# Patient Record
Sex: Male | Born: 1985 | Race: White | Hispanic: No | Marital: Married | Smoking: Never smoker
Health system: Southern US, Community
[De-identification: ages and names within clinical notes are randomized; demographics above are authoritative.]

## PROBLEM LIST (undated history)

## (undated) HISTORY — PX: OTHER SURGICAL HISTORY: SHX169

---
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
## 2005-07-13 ENCOUNTER — Emergency Department (HOSPITAL_COMMUNITY): Admission: EM | Admit: 2005-07-13 | Discharge: 2005-07-13 | Payer: Self-pay | Admitting: Emergency Medicine

## 2020-08-29 ENCOUNTER — Ambulatory Visit: Payer: Self-pay | Admitting: Family Medicine

## 2020-09-06 ENCOUNTER — Ambulatory Visit (INDEPENDENT_AMBULATORY_CARE_PROVIDER_SITE_OTHER): Payer: Managed Care, Other (non HMO) | Admitting: Family Medicine

## 2020-09-06 ENCOUNTER — Encounter: Payer: Self-pay | Admitting: Family Medicine

## 2020-09-06 ENCOUNTER — Other Ambulatory Visit: Payer: Self-pay

## 2020-09-06 VITALS — BP 123/79 | HR 86 | Temp 97.9°F | Wt 161.8 lb

## 2020-09-06 DIAGNOSIS — Z92241 Personal history of systemic steroid therapy: Secondary | ICD-10-CM | POA: Diagnosis not present

## 2020-09-06 DIAGNOSIS — N62 Hypertrophy of breast: Secondary | ICD-10-CM | POA: Insufficient documentation

## 2020-09-06 DIAGNOSIS — Z13220 Encounter for screening for lipoid disorders: Secondary | ICD-10-CM

## 2020-09-06 DIAGNOSIS — R3911 Hesitancy of micturition: Secondary | ICD-10-CM | POA: Diagnosis not present

## 2020-09-06 DIAGNOSIS — R351 Nocturia: Secondary | ICD-10-CM

## 2020-09-06 DIAGNOSIS — Z0000 Encounter for general adult medical examination without abnormal findings: Secondary | ICD-10-CM

## 2020-09-06 NOTE — Assessment & Plan Note (Signed)
Possibly related to previous anabolic steroid use.  Check Testosterone, TSH and estradiol levels.

## 2020-09-06 NOTE — Patient Instructions (Signed)
Great to meet you today! Please have labs completed.  Schedule annual exam at your convenience.

## 2020-09-06 NOTE — Assessment & Plan Note (Signed)
Check UA and PSA 

## 2020-09-06 NOTE — Progress Notes (Signed)
Duane Oliver - 35 y.o. male MRN [id number]  Date of birth: Aug 07, 1985  Subjective Chief Complaint  Patient presents with  . Establish Care    HPI Duane Oliver is a 35 y.o. male here today for initial visit to establish care.  He has not had a PCP since high school.  Concerned about some mild gynecomastia and occasional tenderness around the nipple area.  States that he used anabolic steroid supplement while in college and did not cycle off of this.  Developed breast tissue after this.  He would like to have testosterone and estrogen levels checked today.    He also has had some urinary issues since using this.  Has some hesitancy with urination, mild straining at times.  Doesn't happen all the time.    Has some numb patches over areas of previous injuries.  This comes and goes. Area located on mid back and R great toe.  Not too bothersome.   ROS:  A comprehensive ROS was completed and negative except as noted per HPI  No Known Allergies  History reviewed. No pertinent past medical history.  Past Surgical History:  Procedure Laterality Date  . Broken bone      Social History   Socioeconomic History  . Marital status: Married    Spouse name: Not on file  . Number of children: Not on file  . Years of education: Not on file  . Highest education level: Not on file  Occupational History  . Not on file  Tobacco Use  . Smoking status: Never Smoker  . Smokeless tobacco: Never Used  Vaping Use  . Vaping Use: Never used  Substance and Sexual Activity  . Alcohol use: Yes    Alcohol/week: 4.0 standard drinks    Types: 4 Standard drinks or equivalent per week  . Drug use: Never  . Sexual activity: Yes    Partners: Female    Birth control/protection: Pill  Other Topics Concern  . Not on file  Social History Narrative  . Not on file   Social Determinants of Health   Financial Resource Strain: Not on file  Food Insecurity: Not on file  Transportation Needs: Not on file   Physical Activity: Not on file  Stress: Not on file  Social Connections: Not on file    Family History  Family history unknown: Yes    Health Maintenance  Topic Date Due  . Hepatitis C Screening  Never done  . HIV Screening  Never done  . TETANUS/TDAP  07/29/2029  . INFLUENZA VACCINE  Completed  . COVID-19 Vaccine  Completed     ----------------------------------------------------------------------------------------------------------------------------------------------------------------------------------------------------------------- Physical Exam BP 123/79 (BP Location: Left Arm, Patient Position: Sitting, Cuff Size: Normal)   Pulse 86   Temp 97.9 F (36.6 C)   Wt 161 lb 12.8 oz (73.4 kg)   SpO2 100%   Physical Exam Constitutional:      Appearance: Normal appearance.  Eyes:     General: No scleral icterus. Cardiovascular:     Rate and Rhythm: Normal rate and regular rhythm.  Pulmonary:     Effort: Pulmonary effort is normal.     Breath sounds: Normal breath sounds.  Musculoskeletal:     Cervical back: Neck supple.  Neurological:     General: No focal deficit present.     Mental Status: He is alert.  Psychiatric:        Mood and Affect: Mood normal.        Behavior: Behavior normal.     -------------------------------------------------------------------------------------------------------------------------------------------------------------------------------------------------------------------  Assessment and Plan  Gynecomastia Possibly related to previous anabolic steroid use.  Check Testosterone, TSH and estradiol levels.   Urinary hesitancy Check UA and PSA.   Additional labs ordered for upcoming annual exam  No orders of the defined types were placed in this encounter.   No follow-ups on file.    This visit occurred during the SARS-CoV-2 public health emergency.  Safety protocols were in place, including screening questions prior to the  visit, additional usage of staff PPE, and extensive cleaning of exam room while observing appropriate contact time as indicated for disinfecting solutions.

## 2020-09-07 LAB — TESTOSTERONE: Testosterone: 601 ng/dL (ref 250–827)

## 2020-09-07 LAB — ESTRADIOL: Estradiol: 23 pg/mL (ref <=39)

## 2020-09-08 LAB — COMPLETE METABOLIC PANEL WITH GFR
AG Ratio: 1.8 (calc) (ref 1.0–2.5)
ALT: 15 U/L (ref 9–46)
AST: 18 U/L (ref 10–40)
Albumin: 4.7 g/dL (ref 3.6–5.1)
Alkaline phosphatase (APISO): 67 U/L (ref 36–130)
BUN: 18 mg/dL (ref 7–25)
CO2: 29 mmol/L (ref 20–32)
Calcium: 9.9 mg/dL (ref 8.6–10.3)
Chloride: 102 mmol/L (ref 98–110)
Creat: 1.13 mg/dL (ref 0.60–1.35)
GFR, Est African American: 98 mL/min/{1.73_m2} (ref >=60)
GFR, Est Non African American: 84 mL/min/{1.73_m2} (ref >=60)
Globulin: 2.6 g/dL (calc) (ref 1.9–3.7)
Glucose, Bld: 98 mg/dL (ref 65–139)
Potassium: 4.6 mmol/L (ref 3.5–5.3)
Sodium: 141 mmol/L (ref 135–146)
Total Bilirubin: 0.4 mg/dL (ref 0.2–1.2)
Total Protein: 7.3 g/dL (ref 6.1–8.1)

## 2020-09-08 LAB — LIPID PANEL
Cholesterol: 203 mg/dL — ABNORMAL HIGH (ref <200)
HDL: 50 mg/dL (ref >=40)
LDL Cholesterol (Calc): 138 mg/dL (calc) — ABNORMAL HIGH
Non-HDL Cholesterol (Calc): 153 mg/dL (calc) — ABNORMAL HIGH (ref <130)
Total CHOL/HDL Ratio: 4.1 (calc) (ref <5.0)
Triglycerides: 55 mg/dL (ref <150)

## 2020-09-08 LAB — URINALYSIS
Bilirubin Urine: NEGATIVE
Glucose, UA: NEGATIVE
Hgb urine dipstick: NEGATIVE
Ketones, ur: NEGATIVE
Leukocytes,Ua: NEGATIVE
Nitrite: NEGATIVE
Protein, ur: NEGATIVE
Specific Gravity, Urine: 1.014 (ref 1.001–1.03)
pH: 7 (ref 5.0–8.0)

## 2020-09-08 LAB — CBC
HCT: 45.8 % (ref 38.5–50.0)
Hemoglobin: 15.5 g/dL (ref 13.2–17.1)
MCH: 29.9 pg (ref 27.0–33.0)
MCHC: 33.8 g/dL (ref 32.0–36.0)
MCV: 88.2 fL (ref 80.0–100.0)
MPV: 10.1 fL (ref 7.5–12.5)
Platelets: 265 10*3/uL (ref 140–400)
RBC: 5.19 10*6/uL (ref 4.20–5.80)
RDW: 12.1 % (ref 11.0–15.0)
WBC: 5.8 10*3/uL (ref 3.8–10.8)

## 2020-09-08 LAB — TSH: TSH: 1.74 mIU/L (ref 0.40–4.50)

## 2020-09-08 LAB — PSA: PSA: 1.07 ng/mL (ref <=4.0)

## 2020-09-12 ENCOUNTER — Ambulatory Visit (INDEPENDENT_AMBULATORY_CARE_PROVIDER_SITE_OTHER): Payer: Managed Care, Other (non HMO) | Admitting: Family Medicine

## 2020-09-12 ENCOUNTER — Other Ambulatory Visit: Payer: Self-pay

## 2020-09-12 ENCOUNTER — Encounter: Payer: Self-pay | Admitting: Family Medicine

## 2020-09-12 DIAGNOSIS — Z0000 Encounter for general adult medical examination without abnormal findings: Secondary | ICD-10-CM | POA: Diagnosis not present

## 2020-09-12 NOTE — Assessment & Plan Note (Signed)
Well adult Recent labs reviewed with him.  Immunizations: UTD Screenings: UTD Anticipatory guidance/Risk factor reduction:  Recommendations per AVS.  

## 2020-09-12 NOTE — Patient Instructions (Signed)
Preventive Care 21-35 Years Old, Male Preventive care refers to lifestyle choices and visits with your health care provider that can promote health and wellness. This includes:  A yearly physical exam. This is also called an annual wellness visit.  Regular dental and eye exams.  Immunizations.  Screening for certain conditions.  Healthy lifestyle choices, such as: ? Eating a healthy diet. ? Getting regular exercise. ? Not using drugs or products that contain nicotine and tobacco. ? Limiting alcohol use. What can I expect for my preventive care visit? Physical exam Your health care provider may check your:  Height and weight. These may be used to calculate your BMI (body mass index). BMI is a measurement that tells if you are at a healthy weight.  Heart rate and blood pressure.  Body temperature.  Skin for abnormal spots. Counseling Your health care provider may ask you questions about your:  Past medical problems.  Family's medical history.  Alcohol, tobacco, and drug use.  Emotional well-being.  Home life and relationship well-being.  Sexual activity.  Diet, exercise, and sleep habits.  Work and work environment.  Access to firearms. What immunizations do I need? Vaccines are usually given at various ages, according to a schedule. Your health care provider will recommend vaccines for you based on your age, medical history, and lifestyle or other factors, such as travel or where you work.   What tests do I need? Blood tests  Lipid and cholesterol levels. These may be checked every 5 years starting at age 20.  Hepatitis C test.  Hepatitis B test. Screening  Diabetes screening. This is done by checking your blood sugar (glucose) after you have not eaten for a while (fasting).  Genital exam to check for testicular cancer or hernias.  STD (sexually transmitted disease) testing, if you are at risk. Talk with your health care provider about your test results,  treatment options, and if necessary, the need for more tests.   Follow these instructions at home: Eating and drinking  Eat a healthy diet that includes fresh fruits and vegetables, whole grains, lean protein, and low-fat dairy products.  Drink enough fluid to keep your urine pale yellow.  Take vitamin and mineral supplements as recommended by your health care provider.  Do not drink alcohol if your health care provider tells you not to drink.  If you drink alcohol: ? Limit how much you have to 0-2 drinks a day. ? Be aware of how much alcohol is in your drink. In the [place] one drink equals one 12 oz bottle of beer (355 mL), one 5 oz glass of wine (148 mL), or one 1 oz glass of hard liquor (44 mL).   Lifestyle  Take daily care of your teeth and gums. Brush your teeth every morning and night with fluoride toothpaste. Floss one time each day.  Stay active. Exercise for at least 30 minutes 5 or more days each week.  Do not use any products that contain nicotine or tobacco, such as cigarettes, e-cigarettes, and chewing tobacco. If you need help quitting, ask your health care provider.  Do not use drugs.  If you are sexually active, practice safe sex. Use a condom or other form of protection to prevent STIs (sexually transmitted infections).  Find healthy ways to cope with stress, such as: ? Meditation, yoga, or listening to music. ? Journaling. ? Talking to a trusted person. ? Spending time with friends and family. Safety  Always wear your seat belt while driving   or riding in a vehicle.  Do not drive: ? If you have been drinking alcohol. Do not ride with someone who has been drinking. ? When you are tired or distracted. ? While texting.  Wear a helmet and other protective equipment during sports activities.  If you have firearms in your house, make sure you follow all gun safety procedures.  Seek help if you have been physically or sexually abused. What's next?  Go to your  health care provider once a year for an annual wellness visit.  Ask your health care provider how often you should have your eyes and teeth checked.  Stay up to date on all vaccines. This information is not intended to replace advice given to you by your health care provider. Make sure you discuss any questions you have with your health care provider. Document Revised: 03/31/2019 Document Reviewed: 07/09/2018 Elsevier Patient Education  2021 Elsevier Inc.  

## 2020-09-12 NOTE — Progress Notes (Signed)
Duane Oliver - 35 y.o. male MRN [id number]  Date of birth: Apr 03, 1986  Subjective Chief Complaint  Patient presents with  . Follow-up    HPI Duane Oliver is a 35 y.o. male here today for annual exam.  He has been in good health.  Concerned about testosterone and estrogen levels at last appt due to some mild gynecomastia.  These returned normal.   Cholesterol was a little elevated on recent labs.  Admits to being a little more liberal with diet and not exercising as often.  He is planning on doing better with this.   No new concerns today.    Had COVID vaccines and booster.   Review of Systems  Constitutional: Negative for chills, fever, malaise/fatigue and weight loss.  HENT: Negative for congestion, ear pain and sore throat.   Eyes: Negative for blurred vision, double vision and pain.  Respiratory: Negative for cough and shortness of breath.   Cardiovascular: Negative for chest pain and palpitations.  Gastrointestinal: Negative for abdominal pain, blood in stool, constipation, heartburn and nausea.  Genitourinary: Negative for dysuria and urgency.  Musculoskeletal: Negative for joint pain and myalgias.  Neurological: Negative for dizziness and headaches.  Endo/Heme/Allergies: Does not bruise/bleed easily.  Psychiatric/Behavioral: Negative for depression. The patient is not nervous/anxious and does not have insomnia.       No Known Allergies  History reviewed. No pertinent past medical history.  Past Surgical History:  Procedure Laterality Date  . Broken bone      Social History   Socioeconomic History  . Marital status: Married    Spouse name: Not on file  . Number of children: Not on file  . Years of education: Not on file  . Highest education level: Not on file  Occupational History  . Not on file  Tobacco Use  . Smoking status: Never Smoker  . Smokeless tobacco: Never Used  Vaping Use  . Vaping Use: Never used  Substance and Sexual Activity  . Alcohol  use: Yes    Alcohol/week: 4.0 standard drinks    Types: 4 Standard drinks or equivalent per week  . Drug use: Never  . Sexual activity: Yes    Partners: Female    Birth control/protection: Pill  Other Topics Concern  . Not on file  Social History Narrative  . Not on file   Social Determinants of Health   Financial Resource Strain: Not on file  Food Insecurity: Not on file  Transportation Needs: Not on file  Physical Activity: Not on file  Stress: Not on file  Social Connections: Not on file    Family History  Family history unknown: Yes    Health Maintenance  Topic Date Due  . Hepatitis C Screening  Never done  . HIV Screening  Never done  . TETANUS/TDAP  07/29/2029  . INFLUENZA VACCINE  Completed  . COVID-19 Vaccine  Completed     ----------------------------------------------------------------------------------------------------------------------------------------------------------------------------------------------------------------- Physical Exam BP (!) 143/84 (BP Location: Left Arm, Patient Position: Sitting, Cuff Size: Normal)   Pulse 72   Ht 5' 7.72" (1.72 m)   Wt 161 lb (73 kg)   SpO2 98%   BMI 24.69 kg/m   Physical Exam Constitutional:      General: He is not in acute distress.    Appearance: He is well-nourished.  HENT:     Head: Normocephalic and atraumatic.     Right Ear: External ear normal.     Left Ear: External ear normal.  Mouth/Throat:     Mouth: Oropharynx is clear and moist.  Eyes:     General: No scleral icterus. Neck:     Thyroid: No thyromegaly.  Cardiovascular:     Rate and Rhythm: Normal rate and regular rhythm.     Pulses: Intact distal pulses.     Heart sounds: Normal heart sounds.  Pulmonary:     Effort: Pulmonary effort is normal.     Breath sounds: Normal breath sounds.  Abdominal:     General: Bowel sounds are normal. There is no distension.     Palpations: Abdomen is soft.     Tenderness: There is no abdominal  tenderness. There is no guarding.  Musculoskeletal:        General: No edema.     Cervical back: Normal range of motion.  Lymphadenopathy:     Cervical: No cervical adenopathy.  Skin:    General: Skin is warm and dry.     Findings: No rash.  Neurological:     Mental Status: He is alert and oriented to person, place, and time.     Cranial Nerves: No cranial nerve deficit.     Motor: No abnormal muscle tone.  Psychiatric:        Mood and Affect: Mood and affect normal.        Behavior: Behavior normal.     ------------------------------------------------------------------------------------------------------------------------------------------------------------------------------------------------------------------- Assessment and Plan  Well adult exam Well adult Recent labs reviewed with him.  Immunizations: UTD Screenings: UTD Anticipatory guidance/Risk factor reduction:  Recommendations per AVS.    No orders of the defined types were placed in this encounter.   No follow-ups on file.    This visit occurred during the SARS-CoV-2 public health emergency.  Safety protocols were in place, including screening questions prior to the visit, additional usage of staff PPE, and extensive cleaning of exam room while observing appropriate contact time as indicated for disinfecting solutions.

## 2021-01-21 ENCOUNTER — Encounter: Payer: Self-pay | Admitting: Emergency Medicine

## 2021-01-21 ENCOUNTER — Emergency Department
Admission: EM | Admit: 2021-01-21 | Discharge: 2021-01-21 | Disposition: A | Discharge status: Home / Self Care | Payer: Managed Care, Other (non HMO) | Source: Home / Self Care

## 2021-01-21 ENCOUNTER — Other Ambulatory Visit: Payer: Self-pay

## 2021-01-21 ENCOUNTER — Emergency Department (INDEPENDENT_AMBULATORY_CARE_PROVIDER_SITE_OTHER): Payer: Managed Care, Other (non HMO)

## 2021-01-21 DIAGNOSIS — S29012A Strain of muscle and tendon of back wall of thorax, initial encounter: Secondary | ICD-10-CM

## 2021-01-21 DIAGNOSIS — M546 Pain in thoracic spine: Secondary | ICD-10-CM

## 2021-01-21 DIAGNOSIS — M62830 Muscle spasm of back: Secondary | ICD-10-CM

## 2021-01-21 MED ORDER — BACLOFEN 10 MG PO TABS: 10.0000 mg | ORAL_TABLET | Freq: Three times a day (TID) | ORAL | 0 refills | Status: DC

## 2021-01-21 MED ORDER — METHYLPREDNISOLONE 4 MG PO TBPK: ORAL_TABLET | ORAL | 0 refills | Status: DC

## 2021-01-21 NOTE — Discharge Instructions (Addendum)
Advised patient to take medication as directed with food to completion.  Advised patient may take baclofen daily, or as needed.  Encourage patient increase daily water intake while taking medication.

## 2021-01-21 NOTE — ED Triage Notes (Signed)
Patient presents with upper back pain. About 1 hour ago was picking up his 3 yr son from high chair  to the floor. While placing his son down he started to slip and he readjusted but then had a sudden pain in upper back which dropped him down to the ground. Has not taken any medication for pain. Hx of back issues

## 2021-01-21 NOTE — ED Provider Notes (Signed)
[facility]    CSN: [id number] Arrival date & time: 01/21/21  0836      History   Chief Complaint Chief Complaint  Patient presents with   Back Pain    HPI Duane Oliver is a 35 y.o. male.   HPI 35 year old male presents with upper back pain for 1 to 2 hours.  Reports roughly 1 hour ago was picking up his 72-year-old son from highchair to the floor.  Reports while placing his son down on the ground started slipped and readjusted but sudden pain in upper back which she dropped him down on the ground.  Patient reports history of back pain.  History reviewed. No pertinent past medical history.  Patient Active Problem List   Diagnosis Date Noted   Well adult exam 09/12/2020   Gynecomastia 09/06/2020   Urinary hesitancy 09/06/2020    Past Surgical History:  Procedure Laterality Date   Broken bone         Home Medications    Prior to Admission medications   Medication Sig Start Date End Date Taking? Authorizing Provider  baclofen (LIORESAL) 10 MG tablet Take 1 tablet (10 mg total) by mouth 3 (three) times daily. 01/21/21  Yes Trevor Iha, FNP  methylPREDNISolone (MEDROL DOSEPAK) 4 MG TBPK tablet Take as directed 01/21/21  Yes Trevor Iha, FNP  Multiple Vitamins-Minerals (MULTIVITAMIN GUMMIES MENS PO) Take by mouth.   Yes [provider]    Family History Family History  Family history unknown: Yes    Social History Social History   Tobacco Use   Smoking status: Never   Smokeless tobacco: Never  Vaping Use   Vaping Use: Never used  Substance Use Topics   Alcohol use: Yes    Alcohol/week: 4.0 standard drinks    Types: 4 Standard drinks or equivalent per week   Drug use: Never     Allergies   Patient has no known allergies.   Review of Systems Review of Systems  Musculoskeletal:  Positive for back pain.  All other systems reviewed and are negative.   Physical Exam Triage Vital Signs ED Triage Vitals  Enc Vitals Group      BP 01/21/21 0854 123/83     Pulse Rate 01/21/21 0854 84     Resp 01/21/21 0854 16     Temp 01/21/21 0854 98.9 F (37.2 C)     Temp Source 01/21/21 0854 Oral     SpO2 01/21/21 0854 98 %     Weight --      Height --      Head Circumference --      Peak Flow --      Pain Score 01/21/21 0852 5     Pain Loc --      Pain Edu? --      Excl. in GC? --    No data found.  Updated Vital Signs BP 123/83 (BP Location: Left Arm)   Pulse 84   Temp 98.9 F (37.2 C) (Oral)   Resp 16   SpO2 98%     Physical Exam Vitals and nursing note reviewed.  Constitutional:      General: He is not in acute distress.    Appearance: Normal appearance. He is not ill-appearing.  HENT:     Head: Normocephalic and atraumatic.     Mouth/Throat:     Mouth: Mucous membranes are moist.     Pharynx: Oropharynx is clear.  Eyes:     Extraocular Movements: Extraocular movements  intact.     Conjunctiva/sclera: Conjunctivae normal.     Pupils: Pupils are equal, round, and reactive to light.  Cardiovascular:     Rate and Rhythm: Normal rate and regular rhythm.     Pulses: Normal pulses.     Heart sounds: Normal heart sounds.  Pulmonary:     Effort: Pulmonary effort is normal. No respiratory distress.     Breath sounds: Normal breath sounds. No wheezing, rhonchi or rales.  Musculoskeletal:     Cervical back: Normal range of motion and neck supple. No rigidity.     Comments: Upper back: TTP over paraspinous muscles of T4-T6, no deformity noted, patient reports pain is radiating bilaterally from central upper back  Skin:    General: Skin is warm and dry.  Neurological:     General: No focal deficit present.     Mental Status: He is alert and oriented to person, place, and time.  Psychiatric:        Mood and Affect: Mood normal.        Behavior: Behavior normal.     UC Treatments / Results  Labs (all labs ordered are listed, but only abnormal results are displayed) Labs Reviewed - No data to  display  EKG   Radiology DG Thoracic Spine W/Swimmers  Result Date: 01/21/2021 CLINICAL DATA:  35 year old male with a history upper back pain EXAM: THORACIC SPINE - 3 VIEWS COMPARISON:  None. FINDINGS: There is no evidence of thoracic spine fracture. Alignment is normal. No other significant bone abnormalities are identified. IMPRESSION: Negative. Electronically Signed   By: Gilmer Mor D.O.   On: 01/21/2021 09:46    Procedures Procedures (including critical care time)  Medications Ordered in UC Medications - No data to display  Initial Impression / Assessment and Plan / UC Course  I have reviewed the triage vital signs and the nursing notes.  Pertinent labs & imaging results that were available during my care of the patient were reviewed by me and considered in my medical decision making (see chart for details).     MDM: 1.  Thoracic back pain, 2.  Upper back strain, 3.  Muscle spasm of back. Final Clinical Impressions(s) / UC Diagnoses   Final diagnoses:  Acute bilateral thoracic back pain  Muscle spasm of back  Upper back strain, initial encounter     Discharge Instructions      Advised patient to take medication as directed with food to completion.  Advised patient may take baclofen daily, or as needed.  Encourage patient increase daily water intake while taking medication.     ED Prescriptions     Medication Sig Dispense Auth. Provider   methylPREDNISolone (MEDROL DOSEPAK) 4 MG TBPK tablet Take as directed 1 each Trevor Iha, FNP   baclofen (LIORESAL) 10 MG tablet Take 1 tablet (10 mg total) by mouth 3 (three) times daily. 30 each Trevor Iha, FNP      PDMP not reviewed this encounter.   Trevor Iha, FNP 01/21/21 1017

## 2022-08-31 IMAGING — DX DG THORACIC SPINE 3V
4 series · 4 of 4 positions shown · non-contrast
Comparison: None.

CLINICAL DATA: 34-year-old male with a history upper back pain

EXAM:
THORACIC SPINE - 3 VIEWS

[t-spine ap]
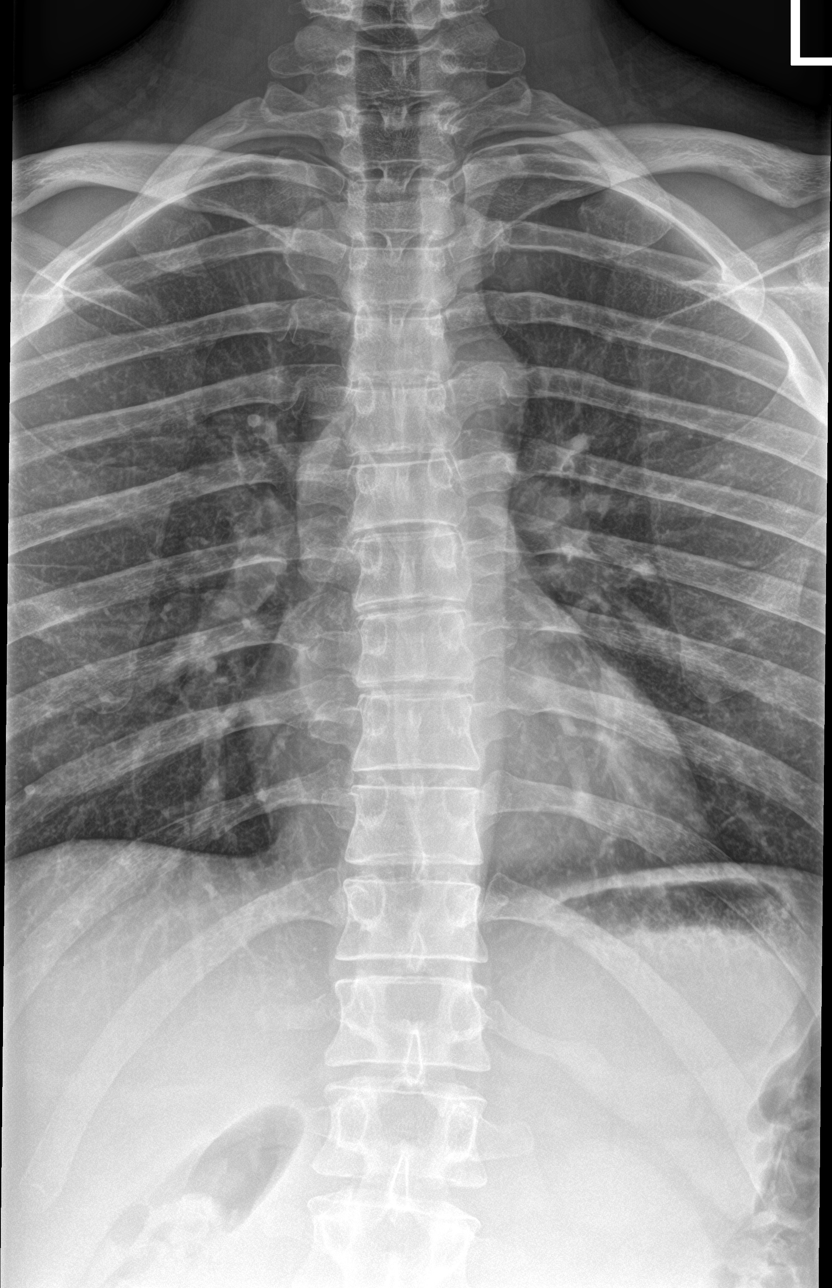

[t-spine lat (1 of 2)]
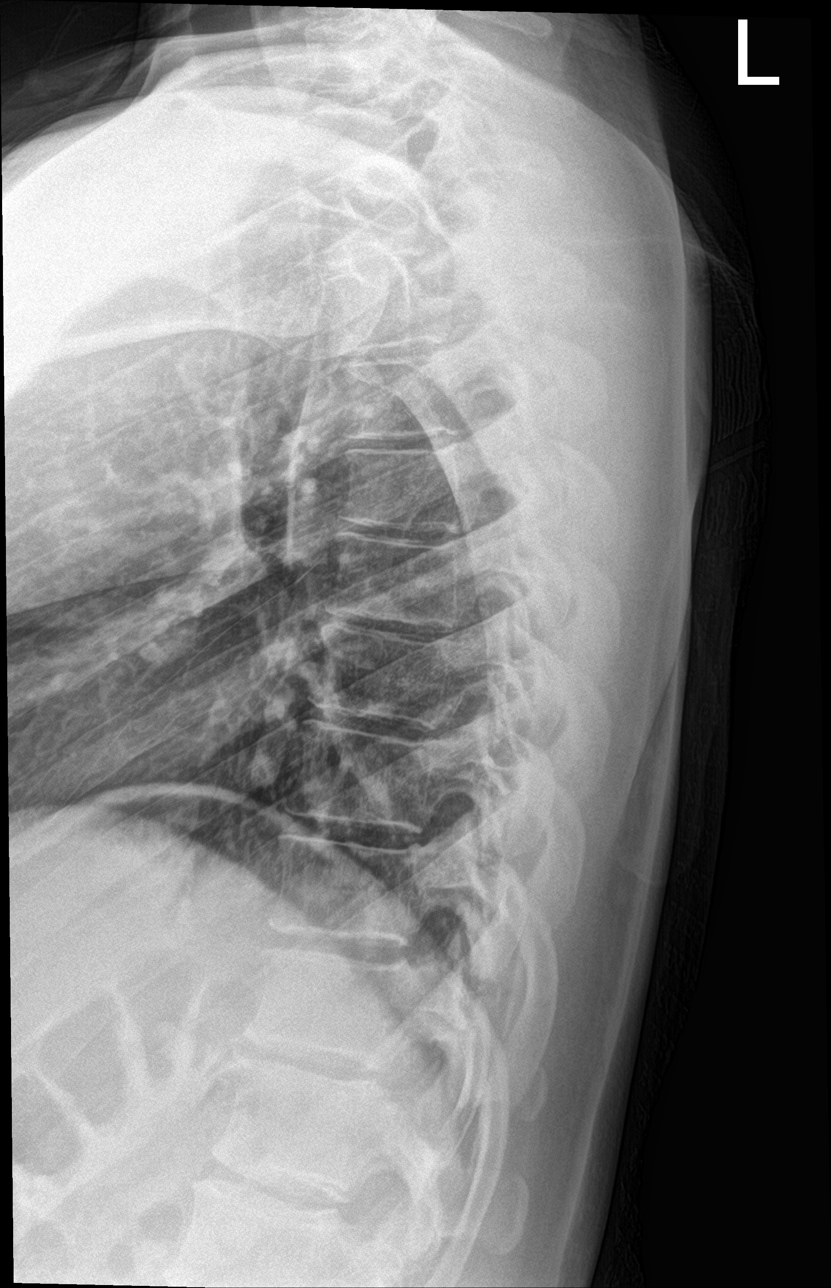

[t-spine swimmers]
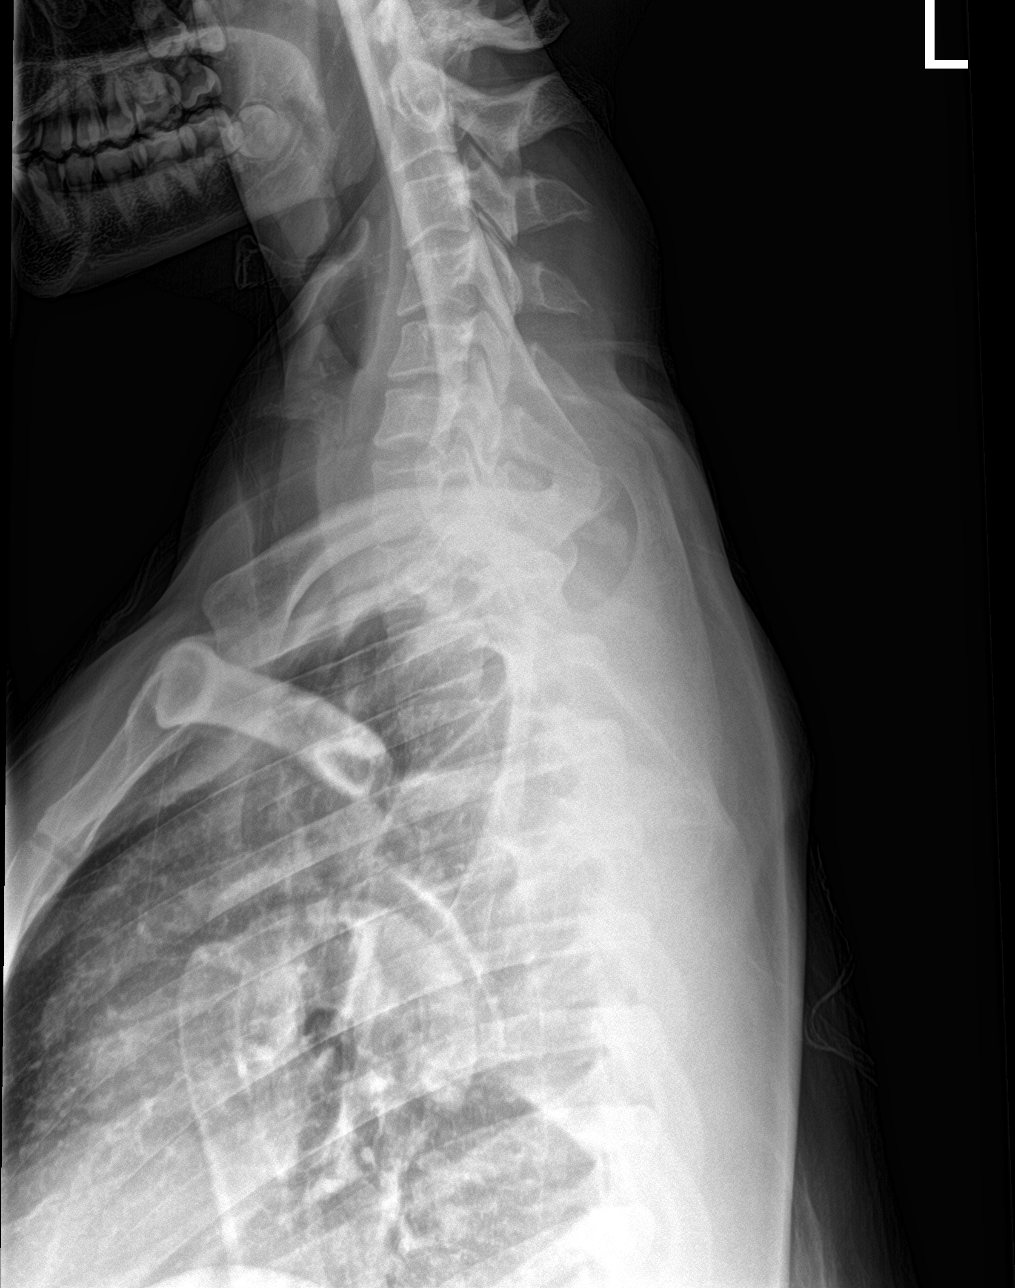

[t-spine lat (2 of 2)]
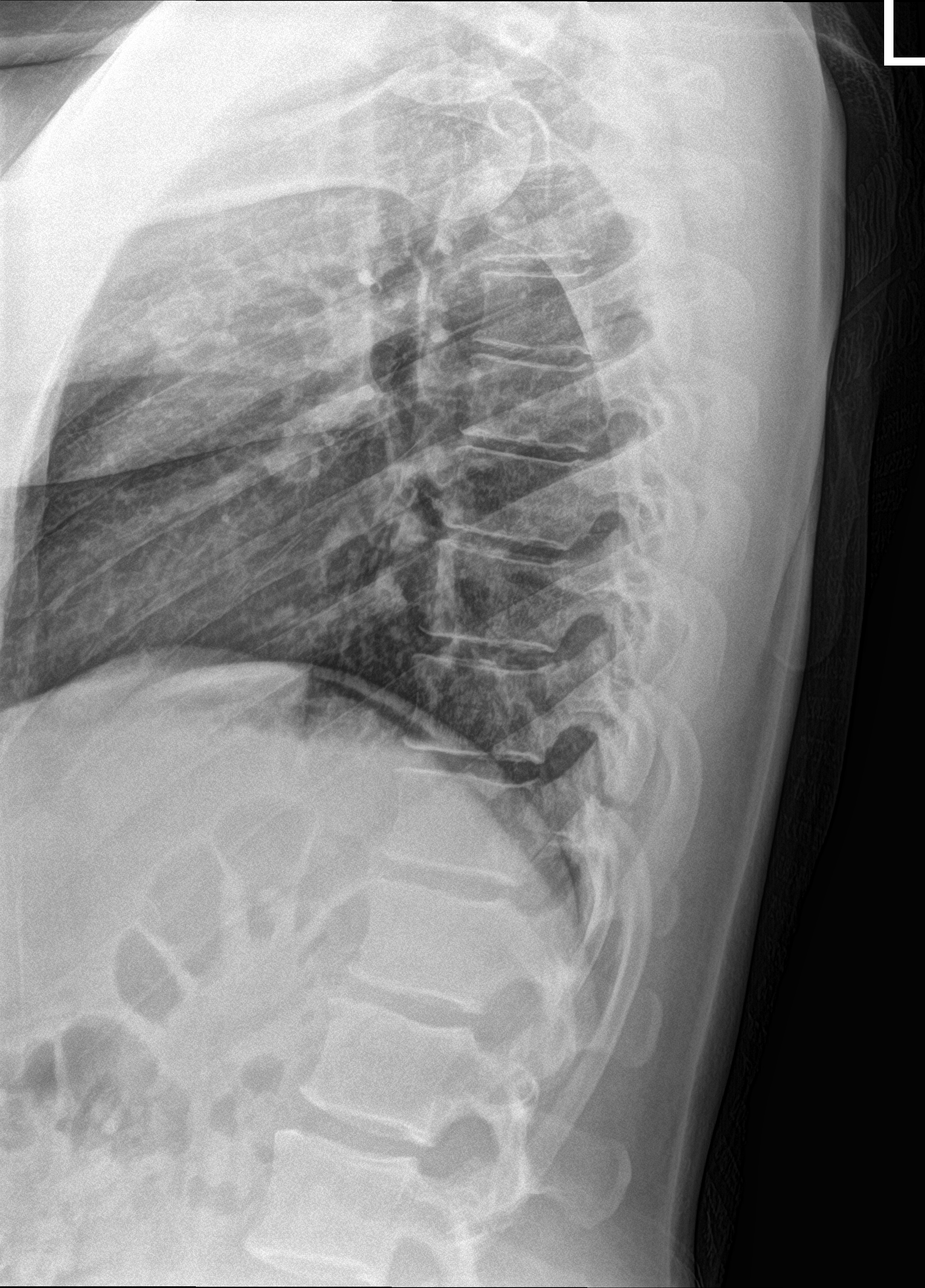

[4 of 4 positions shown; findings below may reference images not displayed]

FINDINGS: There is no evidence of thoracic spine fracture. Alignment is
normal. No other significant bone abnormalities are identified.
IMPRESSION: Negative.

## 2023-05-13 ENCOUNTER — Telehealth: Payer: Managed Care, Other (non HMO) | Admitting: Physician Assistant

## 2023-05-13 DIAGNOSIS — G4700 Insomnia, unspecified: Secondary | ICD-10-CM

## 2023-05-13 DIAGNOSIS — Z7689 Persons encountering health services in other specified circumstances: Secondary | ICD-10-CM

## 2023-05-13 DIAGNOSIS — R030 Elevated blood-pressure reading, without diagnosis of hypertension: Secondary | ICD-10-CM | POA: Diagnosis not present

## 2023-05-13 DIAGNOSIS — F439 Reaction to severe stress, unspecified: Secondary | ICD-10-CM | POA: Insufficient documentation

## 2023-05-13 NOTE — Patient Instructions (Signed)
  Duane Oliver, thank you for joining [name], PA-C for today's virtual visit.  While this provider is not your primary care provider (PCP), if your PCP is located in our provider database this encounter information will be shared with them immediately following your visit.   A [facility] MyChart account gives you access to today's visit and all your visits, tests, and labs performed at [facility] " click here if you don't have a [facility] MyChart account or go to [email or web address]  Consent: (Patient) Duane Oliver provided verbal consent for this virtual visit at the beginning of the encounter.  Current Medications:  Current Outpatient Medications:    baclofen (LIORESAL) 10 MG tablet, Take 1 tablet (10 mg total) by mouth 3 (three) times daily., Disp: 30 each, Rfl: 0   methylPREDNISolone (MEDROL DOSEPAK) 4 MG TBPK tablet, Take as directed, Disp: 1 each, Rfl: 0   Multiple Vitamins-Minerals (MULTIVITAMIN GUMMIES MENS PO), Take by mouth., Disp: , Rfl:    Medications ordered in this encounter:  No orders of the defined types were placed in this encounter.    *If you need refills on other medications prior to your next appointment, please contact your pharmacy*  Follow-Up: Call back or seek an in-person evaluation if the symptoms worsen or if the condition fails to improve as anticipated.  [facility] [phone]  Other Instructions Please remember that you may trial 10mg  Melatonin extended release. You will need to follow up with your PCP in the next 2-3 days to schedule a follow up in person.   If you have been instructed to have an in-person evaluation today at a local Urgent Care facility, please use the link below. It will take you to a list of all of our available [facility], including address, phone number and hours of operation. Please do not delay care.  [facility]  If you or a family member do not have a  primary care provider, use the link below to schedule a visit and establish care. When you choose a [facility] primary care physician or advanced practice provider, you gain a long-term partner in health. Find a Primary Care Provider  Learn more about [facility]'s in-office and virtual care options:  - Get Care Now

## 2023-05-13 NOTE — Progress Notes (Signed)
Virtual Visit Consent   Duane Oliver, you are scheduled for a virtual visit with a  provider today. Just as with appointments in the office, your consent must be obtained to participate. Your consent will be active for this visit and any virtual visit you may have with one of our providers in the next 365 days. If you have a MyChart account, a copy of this consent can be sent to you electronically.  As this is a virtual visit, video technology does not allow for your provider to perform a traditional examination. This may limit your provider's ability to fully assess your condition. If your provider identifies any concerns that need to be evaluated in person or the need to arrange testing (such as labs, EKG, etc.), we will make arrangements to do so. Although advances in technology are sophisticated, we cannot ensure that it will always work on either your end or our end. If the connection with a video visit is poor, the visit may have to be switched to a telephone visit. With either a video or telephone visit, we are not always able to ensure that we have a secure connection.  By engaging in this virtual visit, you consent to the provision of healthcare and authorize for your insurance to be billed (if applicable) for the services provided during this visit. Depending on your insurance coverage, you may receive a charge related to this service.  I need to obtain your verbal consent now. Are you willing to proceed with your visit today? Duane Oliver has provided verbal consent on 05/13/2023 for a virtual visit (video or telephone). Duane Oliver, [place]  Date: 05/13/2023 5:04 PM  Virtual Visit via Video Note   I, Duane Oliver, connected with  HOSIE SHARMAN  ([id number], 1986/03/04) on 05/13/23 at  4:30 PM EDT by a video-enabled telemedicine application and verified that I am speaking with the correct person using two identifiers.  Location: Patient: Virtual Visit Location Patient:  Home Provider: Virtual Visit Location Provider: Home Office   I discussed the limitations of evaluation and management by telemedicine and the availability of in person appointments. The patient expressed understanding and agreed to proceed.    History of Present Illness: Duane Oliver is a 37 y.o. who identifies as a male who was assigned male at birth, and is being seen today for sleep concerns with elevated blood pressure readings. He states he has had this issue for the last 3 weeks. Mr. Kluger shares that he will fall asleep for about an hour then awake multiple time during the night. He states that he has trialed unisom for sleep without improvement. His blood pressure reading on 09/07 was 116/80 and on 10/15 was 140/90 and he doest not have a history of HTN. He also shares that he has not been ceen by his PCP in the last 2-3 years. He denies any chest pain, shortness of breath, fever, or emergent/acute symptoms.   HPI: HPI  Problems:  Patient Active Problem List   Diagnosis Date Noted   Well adult exam 09/12/2020   Gynecomastia 09/06/2020   Urinary hesitancy 09/06/2020    Allergies: No Known Allergies Medications:  Current Outpatient Medications:    baclofen (LIORESAL) 10 MG tablet, Take 1 tablet (10 mg total) by mouth 3 (three) times daily., Disp: 30 each, Rfl: 0   methylPREDNISolone (MEDROL DOSEPAK) 4 MG TBPK tablet, Take as directed, Disp: 1 each, Rfl: 0   Multiple Vitamins-Minerals (MULTIVITAMIN GUMMIES MENS PO), Take  by mouth., Disp: , Rfl:   Observations/Objective: Patient is well-developed, well-nourished in no acute distress.  Resting comfortably at home.  Head is normocephalic, atraumatic.  No labored breathing.  Speech is clear and coherent with logical content.  Patient is alert and oriented at baseline.    Assessment and Plan: 1. Sleep concern  Trial 10mg  extended release Melatonin. Follow up with PCP for physical and laboratory evaluation within the next 2-3 days.    Follow Up Instructions: I discussed the assessment and treatment plan with the patient. The patient was provided an opportunity to ask questions and all were answered. The patient agreed with the plan and demonstrated an understanding of the instructions.  A copy of instructions were sent to the patient via MyChart unless otherwise noted below.    The patient was advised to call back or seek an in-person evaluation if the symptoms worsen or if the condition fails to improve as anticipated.    Duane Kidney, PA-C

## 2023-05-14 ENCOUNTER — Telehealth: Payer: Self-pay | Admitting: Family Medicine

## 2023-05-14 NOTE — Telephone Encounter (Signed)
We can do all of this at his physical as well.    CM

## 2023-05-14 NOTE — Telephone Encounter (Signed)
Patient advised.

## 2023-05-14 NOTE — Telephone Encounter (Signed)
Patient has a physical scheduled but he is concerned about his blood pressure running high 140/90 He is asking if he could get a blood panel done and get his blood pressure checked at a office visit

## 2023-05-15 DIAGNOSIS — Z7689 Persons encountering health services in other specified circumstances: Secondary | ICD-10-CM | POA: Insufficient documentation

## 2023-06-04 ENCOUNTER — Encounter: Payer: Self-pay | Admitting: Family Medicine

## 2023-06-04 ENCOUNTER — Ambulatory Visit (INDEPENDENT_AMBULATORY_CARE_PROVIDER_SITE_OTHER): Payer: Managed Care, Other (non HMO) | Admitting: Family Medicine

## 2023-06-04 VITALS — BP 122/79 | HR 80 | Ht 67.72 in | Wt 164.0 lb

## 2023-06-04 DIAGNOSIS — Z13220 Encounter for screening for lipoid disorders: Secondary | ICD-10-CM | POA: Diagnosis not present

## 2023-06-04 DIAGNOSIS — Z0000 Encounter for general adult medical examination without abnormal findings: Secondary | ICD-10-CM | POA: Diagnosis not present

## 2023-06-04 DIAGNOSIS — Z23 Encounter for immunization: Secondary | ICD-10-CM | POA: Diagnosis not present

## 2023-06-04 NOTE — Patient Instructions (Signed)
Preventive Care 21-37 Years Old, Male Preventive care refers to lifestyle choices and visits with your health care provider that can promote health and wellness. Preventive care visits are also called wellness exams. What can I expect for my preventive care visit? Counseling During your preventive care visit, your health care provider may ask about your: Medical history, including: Past medical problems. Family medical history. Current health, including: Emotional well-being. Home life and relationship well-being. Sexual activity. Lifestyle, including: Alcohol, nicotine or tobacco, and drug use. Access to firearms. Diet, exercise, and sleep habits. Safety issues such as seatbelt and bike helmet use. Sunscreen use. Work and work environment. Physical exam Your health care provider may check your: Height and weight. These may be used to calculate your BMI (body mass index). BMI is a measurement that tells if you are at a healthy weight. Waist circumference. This measures the distance around your waistline. This measurement also tells if you are at a healthy weight and may help predict your risk of certain diseases, such as type 2 diabetes and high blood pressure. Heart rate and blood pressure. Body temperature. Skin for abnormal spots. What immunizations do I need?  Vaccines are usually given at various ages, according to a schedule. Your health care provider will recommend vaccines for you based on your age, medical history, and lifestyle or other factors, such as travel or where you work. What tests do I need? Screening Your health care provider may recommend screening tests for certain conditions. This may include: Lipid and cholesterol levels. Diabetes screening. This is done by checking your blood sugar (glucose) after you have not eaten for a while (fasting). Hepatitis B test. Hepatitis C test. HIV (human immunodeficiency virus) test. STI (sexually transmitted infection)  testing, if you are at risk. Talk with your health care provider about your test results, treatment options, and if necessary, the need for more tests. Follow these instructions at home: Eating and drinking  Eat a healthy diet that includes fresh fruits and vegetables, whole grains, lean protein, and low-fat dairy products. Drink enough fluid to keep your urine pale yellow. Take vitamin and mineral supplements as recommended by your health care provider. Do not drink alcohol if your health care provider tells you not to drink. If you drink alcohol: Limit how much you have to 0-2 drinks a day. Know how much alcohol is in your drink. In the [place] one drink equals one 12 oz bottle of beer (355 mL), one 5 oz glass of wine (148 mL), or one 1 oz glass of hard liquor (44 mL). Lifestyle Brush your teeth every morning and night with fluoride toothpaste. Floss one time each day. Exercise for at least 30 minutes 5 or more days each week. Do not use any products that contain nicotine or tobacco. These products include cigarettes, chewing tobacco, and vaping devices, such as e-cigarettes. If you need help quitting, ask your health care provider. Do not use drugs. If you are sexually active, practice safe sex. Use a condom or other form of protection to prevent STIs. Find healthy ways to manage stress, such as: Meditation, yoga, or listening to music. Journaling. Talking to a trusted person. Spending time with friends and family. Minimize exposure to UV radiation to reduce your risk of skin cancer. Safety Always wear your seat belt while driving or riding in a vehicle. Do not drive: If you have been drinking alcohol. Do not ride with someone who has been drinking. If you have been using any mind-altering substances   or drugs. While texting. When you are tired or distracted. Wear a helmet and other protective equipment during sports activities. If you have firearms in your house, make sure you  follow all gun safety procedures. Seek help if you have been physically or sexually abused. What's next? Go to your health care provider once a year for an annual wellness visit. Ask your health care provider how often you should have your eyes and teeth checked. Stay up to date on all vaccines. This information is not intended to replace advice given to you by your health care provider. Make sure you discuss any questions you have with your health care provider. Document Revised: 01/10/2021 Document Reviewed: 01/10/2021 Elsevier Patient Education  2024 [employer] Inc.  

## 2023-06-04 NOTE — Assessment & Plan Note (Addendum)
Well adult Orders Placed This Encounter  Procedures   Flu vaccine trivalent PF, 6mos and older(Flulaval,Afluria,Fluarix,Fluzone)   CMP14+EGFR   CBC with Differential/Platelet   Lipid Panel With LDL/HDL Ratio  Immunizations: Flu vaccine today Screenings: Per lab orders Anticipatory guidance/Risk factor reduction:  Recommendations per AVS.

## 2023-06-04 NOTE — Progress Notes (Signed)
Duane Oliver - 37 y.o. male MRN [id number]  Date of birth: 09-21-1985  Subjective Chief Complaint  Patient presents with   Annual Exam    HPI Duane Oliver is a 37 y.o. male here today for annual exam.   He reports that he is doing pretty well overall.  He had some elevated BP readings.  Has made lifestyle changes including reduction in EtOH, sodium intake reduction and increased exercise .   He continues to stay fairly active.  Feels that diet is pretty good.   He is a non-smoker.  Occasional EtOH use.   Review of Systems  Constitutional:  Negative for chills, fever, malaise/fatigue and weight loss.  HENT:  Negative for congestion, ear pain and sore throat.   Eyes:  Negative for blurred vision, double vision and pain.  Respiratory:  Negative for cough and shortness of breath.   Cardiovascular:  Negative for chest pain and palpitations.  Gastrointestinal:  Negative for abdominal pain, blood in stool, constipation, heartburn and nausea.  Genitourinary:  Negative for dysuria and urgency.  Musculoskeletal:  Negative for joint pain and myalgias.  Neurological:  Negative for dizziness and headaches.  Endo/Heme/Allergies:  Does not bruise/bleed easily.  Psychiatric/Behavioral:  Negative for depression. The patient is not nervous/anxious and does not have insomnia.     No Known Allergies  History reviewed. No pertinent past medical history.  Past Surgical History:  Procedure Laterality Date   Broken bone      Social History   Socioeconomic History   Marital status: Married    Spouse name: Not on file   Number of children: Not on file   Years of education: Not on file   Highest education level: Bachelor's degree (e.g., BA, AB, BS)  Occupational History   Not on file  Tobacco Use   Smoking status: Never   Smokeless tobacco: Never  Vaping Use   Vaping status: Never Used  Substance and Sexual Activity   Alcohol use: Yes    Alcohol/week: 4.0 standard drinks of alcohol     Types: 4 Standard drinks or equivalent per week   Drug use: Never   Sexual activity: Yes    Partners: Female    Birth control/protection: Pill  Other Topics Concern   Not on file  Social History Narrative   Not on file   Social Determinants of Health   Financial Resource Strain: Low Risk  (05/31/2023)   Overall Financial Resource Strain (CARDIA)    Difficulty of Paying Living Expenses: Not hard at all  Food Insecurity: No Food Insecurity (05/31/2023)   Hunger Vital Sign    Worried About Running Out of Food in the Last Year: Never true    Ran Out of Food in the Last Year: Never true  Transportation Needs: No Transportation Needs (05/31/2023)   PRAPARE - Administrator, Civil Service (Medical): No    Lack of Transportation (Non-Medical): No  Physical Activity: Sufficiently Active (05/31/2023)   Exercise Vital Sign    Days of Exercise per Week: 6 days    Minutes of Exercise per Session: 30 min  Stress: No Stress Concern Present (05/31/2023)   [employer] of Occupational Health - Occupational Stress Questionnaire    Feeling of Stress : Only a little  Social Connections: Unknown (05/31/2023)   Social Connection and Isolation Panel [NHANES]    Frequency of Communication with Friends and Family: Once a week    Frequency of Social Gatherings with Friends and Family: Patient  declined    Attends Religious Services: More than 4 times per year    Active Member of Clubs or Organizations: No    Attends Engineer, structural: Not on file    Marital Status: Married    Family History  Family history unknown: Yes    Health Maintenance  Topic Date Due   COVID-19 Vaccine (4 - 2023-24 season) 06/20/2023 (Originally 03/30/2023)   Hepatitis C Screening  06/03/2024 (Originally 06/16/2004)   HIV Screening  06/03/2024 (Originally 06/16/2001)   DTaP/Tdap/Td (2 - Td or Tdap) 07/29/2029   INFLUENZA VACCINE  Completed   HPV VACCINES  Aged Out      ----------------------------------------------------------------------------------------------------------------------------------------------------------------------------------------------------------------- Physical Exam BP 122/79 (BP Location: Right Arm, Patient Position: Sitting, Cuff Size: Normal)   Pulse 80   Ht 5' 7.72" (1.72 m)   Wt 164 lb (74.4 kg)   SpO2 99%   BMI 25.14 kg/m   Physical Exam Constitutional:      General: He is not in acute distress. HENT:     Head: Normocephalic and atraumatic.     Right Ear: Tympanic membrane and external ear normal.     Left Ear: Tympanic membrane and external ear normal.  Eyes:     General: No scleral icterus. Neck:     Thyroid: No thyromegaly.  Cardiovascular:     Rate and Rhythm: Normal rate and regular rhythm.     Heart sounds: Normal heart sounds.  Pulmonary:     Effort: Pulmonary effort is normal.     Breath sounds: Normal breath sounds.  Abdominal:     General: Bowel sounds are normal. There is no distension.     Palpations: Abdomen is soft.     Tenderness: There is no abdominal tenderness. There is no guarding.  Musculoskeletal:     Cervical back: Normal range of motion.  Lymphadenopathy:     Cervical: No cervical adenopathy.  Skin:    General: Skin is warm and dry.     Findings: No rash.  Neurological:     Mental Status: He is alert and oriented to person, place, and time.     Cranial Nerves: No cranial nerve deficit.     Motor: No abnormal muscle tone.  Psychiatric:        Mood and Affect: Mood normal.        Behavior: Behavior normal.     ------------------------------------------------------------------------------------------------------------------------------------------------------------------------------------------------------------------- Assessment and Plan  Well adult exam Well adult Orders Placed This Encounter  Procedures   Flu vaccine trivalent PF, 6mos and  older(Flulaval,Afluria,Fluarix,Fluzone)   CMP14+EGFR   CBC with Differential/Platelet   Lipid Panel With LDL/HDL Ratio  Immunizations: Flu vaccine today Screenings: Per lab orders Anticipatory guidance/Risk factor reduction:  Recommendations per AVS.    No orders of the defined types were placed in this encounter.   No follow-ups on file.    This visit occurred during the SARS-CoV-2 public health emergency.  Safety protocols were in place, including screening questions prior to the visit, additional usage of staff PPE, and extensive cleaning of exam room while observing appropriate contact time as indicated for disinfecting solutions.

## 2023-06-05 LAB — CMP14+EGFR
ALT: 19 [IU]/L (ref 0–44)
AST: 22 [IU]/L (ref 0–40)
Albumin: 4.5 g/dL (ref 4.1–5.1)
Alkaline Phosphatase: 64 [IU]/L (ref 44–121)
BUN/Creatinine Ratio: 19 (ref 9–20)
BUN: 21 mg/dL — ABNORMAL HIGH (ref 6–20)
Bilirubin Total: 0.3 mg/dL (ref 0.0–1.2)
CO2: 25 mmol/L (ref 20–29)
Calcium: 9.5 mg/dL (ref 8.7–10.2)
Chloride: 102 mmol/L (ref 96–106)
Creatinine, Ser: 1.08 mg/dL (ref 0.76–1.27)
Globulin, Total: 2.1 g/dL (ref 1.5–4.5)
Glucose: 98 mg/dL (ref 70–99)
Potassium: 5.1 mmol/L (ref 3.5–5.2)
Sodium: 140 mmol/L (ref 134–144)
Total Protein: 6.6 g/dL (ref 6.0–8.5)
eGFR: 91 mL/min/{1.73_m2} (ref >59)

## 2023-06-05 LAB — CBC WITH DIFFERENTIAL/PLATELET
Basophils Absolute: 0 10*3/uL (ref 0.0–0.2)
Basos: 1 %
EOS (ABSOLUTE): 0.1 10*3/uL (ref 0.0–0.4)
Eos: 2 %
Hematocrit: 45.8 % (ref 37.5–51.0)
Hemoglobin: 14.9 g/dL (ref 13.0–17.7)
Immature Grans (Abs): 0 10*3/uL (ref 0.0–0.1)
Immature Granulocytes: 0 %
Lymphocytes Absolute: 2 10*3/uL (ref 0.7–3.1)
Lymphs: 49 %
MCH: 30.2 pg (ref 26.6–33.0)
MCHC: 32.5 g/dL (ref 31.5–35.7)
MCV: 93 fL (ref 79–97)
Monocytes Absolute: 0.4 10*3/uL (ref 0.1–0.9)
Monocytes: 9 %
Neutrophils Absolute: 1.6 10*3/uL (ref 1.4–7.0)
Neutrophils: 39 %
Platelets: 256 10*3/uL (ref 150–450)
RBC: 4.94 x10E6/uL (ref 4.14–5.80)
RDW: 11.9 % (ref 11.6–15.4)
WBC: 4 10*3/uL (ref 3.4–10.8)

## 2023-06-05 LAB — LIPID PANEL WITH LDL/HDL RATIO
Cholesterol, Total: 158 mg/dL (ref 100–199)
HDL: 45 mg/dL (ref >39)
LDL Chol Calc (NIH): 96 mg/dL (ref 0–99)
LDL/HDL Ratio: 2.1 ratio (ref 0.0–3.6)
Triglycerides: 92 mg/dL (ref 0–149)
VLDL Cholesterol Cal: 17 mg/dL (ref 5–40)

## 2023-06-07 ENCOUNTER — Encounter: Payer: Self-pay | Admitting: Family Medicine

## 2023-06-29 ENCOUNTER — Encounter: Payer: Self-pay | Admitting: Family Medicine
